# Patient Record
Sex: Male | Born: 1995 | Race: Black or African American | Hispanic: No | Marital: Single | Smoking: Never smoker
Health system: Southern US, Community
[De-identification: ages and names within clinical notes are randomized; demographics above are authoritative.]

---
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
## 2017-12-10 ENCOUNTER — Other Ambulatory Visit: Payer: Self-pay

## 2017-12-10 ENCOUNTER — Emergency Department (HOSPITAL_BASED_OUTPATIENT_CLINIC_OR_DEPARTMENT_OTHER)
Admission: EM | Admit: 2017-12-10 | Discharge: 2017-12-10 | Disposition: A | Discharge status: Home / Self Care | Payer: BLUE CROSS/BLUE SHIELD | Attending: Emergency Medicine | Admitting: Emergency Medicine

## 2017-12-10 ENCOUNTER — Encounter (HOSPITAL_BASED_OUTPATIENT_CLINIC_OR_DEPARTMENT_OTHER): Payer: Self-pay

## 2017-12-10 DIAGNOSIS — R109 Unspecified abdominal pain: Secondary | ICD-10-CM | POA: Diagnosis not present

## 2017-12-10 DIAGNOSIS — R197 Diarrhea, unspecified: Secondary | ICD-10-CM | POA: Diagnosis present

## 2017-12-10 DIAGNOSIS — Z79899 Other long term (current) drug therapy: Secondary | ICD-10-CM | POA: Diagnosis not present

## 2017-12-10 LAB — URINALYSIS, MICROSCOPIC (REFLEX)

## 2017-12-10 LAB — CBC
HCT: 44.8 % (ref 39.0–52.0)
HEMOGLOBIN: 15.7 g/dL (ref 13.0–17.0)
MCH: 27 pg (ref 26.0–34.0)
MCHC: 35 g/dL (ref 30.0–36.0)
MCV: 77.1 fL — ABNORMAL LOW (ref 78.0–100.0)
PLATELETS: 182 10*3/uL (ref 150–400)
RBC: 5.81 MIL/uL (ref 4.22–5.81)
RDW: 13.3 % (ref 11.5–15.5)
WBC: 6.2 10*3/uL (ref 4.0–10.5)

## 2017-12-10 LAB — I-STAT CHEM 8, ED
BUN: 16 mg/dL (ref 6–20)
CHLORIDE: 101 mmol/L (ref 101–111)
Calcium, Ion: 1.08 mmol/L — ABNORMAL LOW (ref 1.15–1.40)
Creatinine, Ser: 1.2 mg/dL (ref 0.61–1.24)
Glucose, Bld: 110 mg/dL — ABNORMAL HIGH (ref 65–99)
HEMATOCRIT: 47 % (ref 39.0–52.0)
Hemoglobin: 16 g/dL (ref 13.0–17.0)
Potassium: 3.2 mmol/L — ABNORMAL LOW (ref 3.5–5.1)
SODIUM: 139 mmol/L (ref 135–145)
TCO2: 24 mmol/L (ref 22–32)

## 2017-12-10 LAB — URINALYSIS, ROUTINE W REFLEX MICROSCOPIC
Glucose, UA: NEGATIVE mg/dL
Hgb urine dipstick: NEGATIVE
Ketones, ur: 15 mg/dL — AB
Leukocytes, UA: NEGATIVE
Nitrite: NEGATIVE
PROTEIN: 30 mg/dL — AB
Specific Gravity, Urine: 1.025 (ref 1.005–1.030)
pH: 6 (ref 5.0–8.0)

## 2017-12-10 LAB — LIPASE, BLOOD: Lipase: 21 U/L (ref 11–51)

## 2017-12-10 MED ORDER — SODIUM CHLORIDE 0.9 % IV BOLUS (SEPSIS)
1000.0000 mL | Freq: Once | INTRAVENOUS | Status: AC
  Administered 2017-12-10: 1000 mL via INTRAVENOUS

## 2017-12-10 NOTE — ED Provider Notes (Signed)
[facility] EMERGENCY DEPARTMENT Provider Note   CSN: [id number] Arrival date & time: 12/10/17  0347     History   Chief Complaint Chief Complaint  Patient presents with  . Abdominal Pain    HPI Nathaniel Kingston Shawgo. is a 22 y.o. male.  HPI 22 year old male presents with 4-5 days of diarrhea.  No blood in his stool.  Denies nausea vomiting.  Reports crampy abdominal pain.  Denies decreased oral intake.  No sick contacts.  No fevers.  Symptoms are mild in severity.   History reviewed. No pertinent past medical history.  Patient Active Problem List   Diagnosis Date Noted  . Avulsion fracture of pelvis (HCC) 09/20/2011    History reviewed. No pertinent surgical history.     Home Medications    Prior to Admission medications   Medication Sig Start Date End Date Taking? Authorizing Provider  Clindamycin-Benzoyl Per, Refr, gel  07/29/11  Yes [provider]    Family History Family History  Problem Relation Age of Onset  . Hypertension Father   . Hypertension Maternal Grandmother   . Hypertension Paternal Grandmother   . Sudden death Neg Hx   . Hyperlipidemia Neg Hx   . Heart attack Neg Hx   . Diabetes Neg Hx     Social History Social History   Tobacco Use  . Smoking status: Never Smoker  . Smokeless tobacco: Never Used  Substance Use Topics  . Alcohol use: No    Frequency: Never  . Drug use: No     Allergies   Patient has no known allergies.   Review of Systems Review of Systems  All other systems reviewed and are negative.    Physical Exam Updated Vital Signs BP 138/79 (BP Location: Right Arm)   Pulse (!) 105   Temp 98.9 F (37.2 C) (Oral)   Resp 18   Ht 6' (1.829 m)   Wt 104.3 kg (230 lb)   SpO2 99%   BMI 31.19 kg/m   Physical Exam  Constitutional: He is oriented to person, place, and time.  HENT:  Head: Normocephalic and atraumatic.  Eyes: EOM are normal.  Neck: Normal range of motion.  Cardiovascular: Normal  rate, regular rhythm and intact distal pulses.  Pulmonary/Chest: Effort normal and breath sounds normal. No respiratory distress.  Abdominal: Soft. He exhibits no distension. There is no tenderness.  Musculoskeletal: Normal range of motion.  Neurological: He is alert and oriented to person, place, and time.  Skin: Skin is warm and dry.  Nursing note and vitals reviewed.    ED Treatments / Results  Labs (all labs ordered are listed, but only abnormal results are displayed) Labs Reviewed  CBC - Abnormal; Notable for the following components:      Result Value   MCV 77.1 (*)    All other components within normal limits  URINALYSIS, ROUTINE W REFLEX MICROSCOPIC - Abnormal; Notable for the following components:   Bilirubin Urine SMALL (*)    Ketones, ur 15 (*)    Protein, ur 30 (*)    All other components within normal limits  URINALYSIS, MICROSCOPIC (REFLEX) - Abnormal; Notable for the following components:   Bacteria, UA RARE (*)    Squamous Epithelial / LPF 0-5 (*)    All other components within normal limits  I-STAT CHEM 8, ED - Abnormal; Notable for the following components:   Potassium 3.2 (*)    Glucose, Bld 110 (*)    Calcium, Ion 1.08 (*)  All other components within normal limits  LIPASE, BLOOD    EKG  EKG Interpretation None       Radiology No results found.  Procedures Procedures (including critical care time)  Medications Ordered in ED Medications  sodium chloride 0.9 % bolus 1,000 mL (1,000 mLs Intravenous New Bag/Given 12/10/17 0845)     Initial Impression / Assessment and Plan / ED Course  I have reviewed the triage vital signs and the nursing notes.  Pertinent labs & imaging results that were available during my care of the patient were reviewed by me and considered in my medical decision making (see chart for details).     Well-appearing.  Hydrated.  Diarrheal illness.  No travel outside the country.  Discharged home in good condition with  instructions for ongoing oral hydration.  No recent antibiotics to suggest C. difficile colitis  Final Clinical Impressions(s) / ED Diagnoses   Final diagnoses:  Acute diarrhea    ED Discharge Orders    None       Azalia Bilisampos, Carry Weesner, MD 12/10/17 [phone]

## 2017-12-10 NOTE — ED Triage Notes (Signed)
Pt reports upper abdominal pain x 2 days with diarrhea. Pt denies N/V or fevers.

## 2017-12-10 NOTE — ED Notes (Signed)
Pt made aware of the need for urine sample. Unable to provide at this time.
